# Patient Record
Sex: Female | Born: 1980 | Race: White | Hispanic: No | Marital: Single | Smoking: Never smoker
Health system: Southern US, Community
[De-identification: ages and names within clinical notes are randomized; demographics above are authoritative.]

## PROBLEM LIST (undated history)

## (undated) DIAGNOSIS — F419 Anxiety disorder, unspecified: Secondary | ICD-10-CM

## (undated) DIAGNOSIS — I10 Essential (primary) hypertension: Secondary | ICD-10-CM

## (undated) DIAGNOSIS — F410 Panic disorder [episodic paroxysmal anxiety] without agoraphobia: Secondary | ICD-10-CM

## (undated) DIAGNOSIS — E119 Type 2 diabetes mellitus without complications: Secondary | ICD-10-CM

## (undated) DIAGNOSIS — E7800 Pure hypercholesterolemia, unspecified: Secondary | ICD-10-CM

## (undated) HISTORY — PX: CYSTECTOMY: SHX5119

## (undated) HISTORY — PX: APPENDECTOMY: SHX54

## (undated) HISTORY — PX: ABDOMINAL HYSTERECTOMY: SHX81

## (undated) HISTORY — PX: LITHOTRIPSY: SUR834

---

## 2005-01-03 ENCOUNTER — Ambulatory Visit: Payer: Self-pay | Admitting: Nurse Practitioner

## 2005-01-07 ENCOUNTER — Ambulatory Visit: Payer: Self-pay | Admitting: *Deleted

## 2005-01-07 ENCOUNTER — Ambulatory Visit: Payer: Self-pay | Admitting: Nurse Practitioner

## 2006-09-03 ENCOUNTER — Emergency Department (HOSPITAL_COMMUNITY): Admission: EM | Admit: 2006-09-03 | Discharge: 2006-09-04 | Payer: Self-pay | Admitting: Emergency Medicine

## 2007-08-24 ENCOUNTER — Other Ambulatory Visit: Admission: RE | Admit: 2007-08-24 | Discharge: 2007-08-24 | Payer: Self-pay | Admitting: Obstetrics and Gynecology

## 2008-06-15 ENCOUNTER — Ambulatory Visit (HOSPITAL_COMMUNITY): Admission: RE | Admit: 2008-06-15 | Discharge: 2008-06-15 | Payer: Self-pay | Admitting: Neurology

## 2008-10-13 ENCOUNTER — Other Ambulatory Visit: Admission: RE | Admit: 2008-10-13 | Discharge: 2008-10-13 | Payer: Self-pay | Admitting: Obstetrics and Gynecology

## 2008-10-31 ENCOUNTER — Encounter: Admission: RE | Admit: 2008-10-31 | Discharge: 2008-10-31 | Payer: Self-pay | Admitting: Family Medicine

## 2009-12-15 ENCOUNTER — Other Ambulatory Visit: Admission: RE | Admit: 2009-12-15 | Discharge: 2009-12-15 | Payer: Self-pay | Admitting: Obstetrics and Gynecology

## 2011-01-04 NOTE — Op Note (Signed)
Crystal Ray, Crystal Ray               ACCOUNT NO.:  [id number]   MEDICAL RECORD NO.:  [id number]          PATIENT TYPE:  EMS   LOCATION:  ED                           FACILITY:  [facility]   PHYSICIAN:  Lucky Cowboy, MD         DATE OF BIRTH:  12-20-1980   DATE OF PROCEDURE:  09/04/2006  DATE OF DISCHARGE:                               OPERATIVE REPORT   PREOPERATIVE DIAGNOSES:  1. Complex right lateral lower lip laceration.  2. Simple left cheek lacerations.  3. Left nasal fracture.  4. Chin laceration.   POSTOPERATIVE DIAGNOSIS:  1. Complex right lateral lower lip laceration.  2. Simple left cheek lacerations.  3. Left nasal fracture.  4. Chin laceration.   PROCEDURE:  1. Closure of complex right lower lip and chin.  2. Closure simple left cheek lacerations.   SURGEON:  Lucky Cowboy, MD   ANESTHESIA:  Local.   ESTIMATED BLOOD LOSS:  Less than 5 mL.   SPECIMENS:  None.   COMPLICATIONS:  None.   INDICATIONS:  The patient is a 30 year old female who fell down some  hardwood stairs earlier this evening.  A CT scan performed indicated a  left nasal fracture, but no other bony abnormalities.  Findings on  physical examination were all normal with the exception of a complex  through-and-through left lateral lower lip and chin laceration that was  5 cm in length.  This extended into the mucosal surface.  Other small  lacerations were noted in the left cheek.   PROCEDURE:  The patient remained on the emergency room gurney.  She was  given morphine and Versed for sedation.  Betadine was used to prep all  wounds; and the procedure was carried out sterilely after sterile  draping.  Irrigation was performed to all wounds.  The lower lip and  chin was reapproximated in a simple interrupted buried fashion using 4-0  Vicryl.  The outer skin was closed in a simple interrupted fashion,  using 5-0 nylon.  The mucosal surface was closed in a simple interrupted  stitch using 4-0 Vicryl.  The  wound was cleansed and bacitracin ointment  was applied.  The skin suture was applied to the left cheek lacerations  which numbered 2; and more proximally 1 cm each.  They were  reapproximated in a simple interrupted fashion using 5-0 Prolene.   It was noted that the patient did have some bloody crusting in the left  nasal cavity, but there was no laceration noted; and no septal hematoma  on either side.  The patient also was noted to have inability to depress  the right lower lip for obvious laceration location.  It did appear that  sensation was intact to gross detection as she had already been given  morphine prior to my arrival.   DISCHARGE INSTRUCTIONS INCLUDE:  To keep the wounds dry and clean with  saline applied and apply Bacitracin ointment.   MEDICATIONS FOR DISCHARGE:  1. Augmentin 875 mg one p.o. b.i.d. for 10 days.  2. Peridex oral  rinse 480 mL, 10 mL, swish and  spit out tid for 7      days.  3. Tylenol No. 3 #30 with no refills and was 1-2 p.o. q. 4-6 h. p.r.n.      pain.   She returned to the office in 5-6 days for suture removal.      Lucky Cowboy, MD  Electronically Signed     SJ/MEDQ  D:  09/04/2006  T:  09/04/2006  Job:  [id number]

## 2017-07-11 ENCOUNTER — Encounter: Payer: Self-pay | Admitting: *Deleted

## 2017-07-11 NOTE — Congregational Nurse Program (Signed)
Congregational Nurse Program Note  Date of Encounter: 07/11/2017  Past Medical History: No past medical history on file.  Encounter Details: CNP Questionnaire - 07/11/17 1056      Questionnaire   Patient Status  Not Applicable    Race  White or Caucasian    Location Patient Served At  Not Applicable    Insurance  Medicaid    Food  No food insecurities    Housing/Utilities  No permanent housing    Transportation  No transportation needs    Interpersonal Safety  Yes, feel physically and emotionally safe where you currently live    Medication  No medication insecurities    Medical Provider  Yes    Referrals  Medicaid    ED Visit Averted  Not Applicable    Life-Saving Intervention Made  Not Applicable

## 2017-07-11 NOTE — Progress Notes (Signed)
Patient ID: Crystal PerfectShannan O'Neal, female   DOB: 09/09/1980, 36 y.o.   MRN: [id number] Patient wanting information on medicaid usage and changing of md within the system.  Encouraged to call the local ss department for further instructions.

## 2017-07-11 NOTE — Patient Instructions (Addendum)
Go to the local social [place] office or call on Monday [phone] for instructions on how to change providers. Health Maintenance, Female Adopting a healthy lifestyle and getting preventive care can go a long way to promote health and wellness. Talk with your health care provider about what schedule of regular examinations is right for you. This is a good chance for you to check in with your provider about disease prevention and staying healthy. In between checkups, there are plenty of things you can do on your own. Experts have done a lot of research about which lifestyle changes and preventive measures are most likely to keep you healthy. Ask your health care provider for more information. Weight and diet Eat a healthy diet  Be sure to include plenty of vegetables, fruits, low-fat dairy products, and lean protein.  Do not eat a lot of foods high in solid fats, added sugars, or salt.  Get regular exercise. This is one of the most important things you can do for your health. ? Most adults should exercise for at least 150 minutes each week. The exercise should increase your heart rate and make you sweat (moderate-intensity exercise). ? Most adults should also do strengthening exercises at least twice a week. This is in addition to the moderate-intensity exercise.  Maintain a healthy weight  Body mass index (BMI) is a measurement that can be used to identify possible weight problems. It estimates body fat based on height and weight. Your health care provider can help determine your BMI and help you achieve or maintain a healthy weight.  For females 31 years of age and older: ? A BMI below 18.5 is considered underweight. ? A BMI of 18.5 to 24.9 is normal. ? A BMI of 25 to 29.9 is considered overweight. ? A BMI of 30 and above is considered obese.  Watch levels of cholesterol and blood lipids  You should start having your blood tested for lipids and cholesterol at 38 years of age, then have  this test every 5 years.  You may need to have your cholesterol levels checked more often if: ? Your lipid or cholesterol levels are high. ? You are older than 38 years of age. ? You are at high risk for heart disease.  Cancer screening Lung Cancer  Lung cancer screening is recommended for adults 44-32 years old who are at high risk for lung cancer because of a history of smoking.  A yearly low-dose CT scan of the lungs is recommended for people who: ? Currently smoke. ? Have quit within the past 15 years. ? Have at least a 30-pack-year history of smoking. A pack year is smoking an average of one pack of cigarettes a day for 1 year.  Yearly screening should continue until it has been 15 years since you quit.  Yearly screening should stop if you develop a health problem that would prevent you from having lung cancer treatment.  Breast Cancer  Practice breast self-awareness. This means understanding how your breasts normally appear and feel.  It also means doing regular breast self-exams. Let your health care provider know about any changes, no matter how small.  If you are in your 20s or 30s, you should have a clinical breast exam (CBE) by a health care provider every 1-3 years as part of a regular health exam.  If you are 55 or older, have a CBE every year. Also consider having a breast X-ray (mammogram) every year.  If you have a family history  of breast cancer, talk to your health care provider about genetic screening.  If you are at high risk for breast cancer, talk to your health care provider about having an MRI and a mammogram every year.  Breast cancer gene (BRCA) assessment is recommended for women who have family members with BRCA-related cancers. BRCA-related cancers include: ? Breast. ? Ovarian. ? Tubal. ? Peritoneal cancers.  Results of the assessment will determine the need for genetic counseling and BRCA1 and BRCA2 testing.  Cervical Cancer Your health care  provider may recommend that you be screened regularly for cancer of the pelvic organs (ovaries, uterus, and vagina). This screening involves a pelvic examination, including checking for microscopic changes to the surface of your cervix (Pap test). You may be encouraged to have this screening done every 3 years, beginning at age 20.  For women ages 58-65, health care providers may recommend pelvic exams and Pap testing every 3 years, or they may recommend the Pap and pelvic exam, combined with testing for human papilloma virus (HPV), every 5 years. Some types of HPV increase your risk of cervical cancer. Testing for HPV may also be done on women of any age with unclear Pap test results.  Other health care providers may not recommend any screening for nonpregnant women who are considered low risk for pelvic cancer and who do not have symptoms. Ask your health care provider if a screening pelvic exam is right for you.  If you have had past treatment for cervical cancer or a condition that could lead to cancer, you need Pap tests and screening for cancer for at least 20 years after your treatment. If Pap tests have been discontinued, your risk factors (such as having a new sexual partner) need to be reassessed to determine if screening should resume. Some women have medical problems that increase the chance of getting cervical cancer. In these cases, your health care provider may recommend more frequent screening and Pap tests.  Colorectal Cancer  This type of cancer can be detected and often prevented.  Routine colorectal cancer screening usually begins at 38 years of age and continues through 38 years of age.  Your health care provider may recommend screening at an earlier age if you have risk factors for colon cancer.  Your health care provider may also recommend using home test kits to check for hidden blood in the stool.  A small camera at the end of a tube can be used to examine your colon  directly (sigmoidoscopy or colonoscopy). This is done to check for the earliest forms of colorectal cancer.  Routine screening usually begins at age 27.  Direct examination of the colon should be repeated every 5-10 years through 38 years of age. However, you may need to be screened more often if early forms of precancerous polyps or small growths are found.  Skin Cancer  Check your skin from head to toe regularly.  Tell your health care provider about any new moles or changes in moles, especially if there is a change in a mole's shape or color.  Also tell your health care provider if you have a mole that is larger than the size of a pencil eraser.  Always use sunscreen. Apply sunscreen liberally and repeatedly throughout the day.  Protect yourself by wearing long sleeves, pants, a wide-brimmed hat, and sunglasses whenever you are outside.  Heart disease, diabetes, and high blood pressure  High blood pressure causes heart disease and increases the risk of stroke. High  blood pressure is more likely to develop in: ? People who have blood pressure in the high end of the normal range (130-139/85-89 mm Hg). ? People who are overweight or obese. ? People who are African American.  If you are 90-2 years of age, have your blood pressure checked every 3-5 years. If you are 53 years of age or older, have your blood pressure checked every year. You should have your blood pressure measured twice-once when you are at a hospital or clinic, and once when you are not at a hospital or clinic. Record the average of the two measurements. To check your blood pressure when you are not at a hospital or clinic, you can use: ? An automated blood pressure machine at a pharmacy. ? A home blood pressure monitor.  If you are between 37 years and 1 years old, ask your health care provider if you should take aspirin to prevent strokes.  Have regular diabetes screenings. This involves taking a blood sample to  check your fasting blood sugar level. ? If you are at a normal weight and have a low risk for diabetes, have this test once every three years after 38 years of age. ? If you are overweight and have a high risk for diabetes, consider being tested at a younger age or more often. Preventing infection Hepatitis B  If you have a higher risk for hepatitis B, you should be screened for this virus. You are considered at high risk for hepatitis B if: ? You were born in a country where hepatitis B is common. Ask your health care provider which countries are considered high risk. ? Your parents were born in a high-risk country, and you have not been immunized against hepatitis B (hepatitis B vaccine). ? You have HIV or AIDS. ? You use needles to inject street drugs. ? You live with someone who has hepatitis B. ? You have had sex with someone who has hepatitis B. ? You get hemodialysis treatment. ? You take certain medicines for conditions, including cancer, organ transplantation, and autoimmune conditions.  Hepatitis C  Blood testing is recommended for: ? Everyone born from [birth year] through 1967. ? Anyone with known risk factors for hepatitis C.  Sexually transmitted infections (STIs)  You should be screened for sexually transmitted infections (STIs) including gonorrhea and chlamydia if: ? You are sexually active and are younger than 38 years of age. ? You are older than 38 years of age and your health care provider tells you that you are at risk for this type of infection. ? Your sexual activity has changed since you were last screened and you are at an increased risk for chlamydia or gonorrhea. Ask your health care provider if you are at risk.  If you do not have HIV, but are at risk, it may be recommended that you take a prescription medicine daily to prevent HIV infection. This is called pre-exposure prophylaxis (PrEP). You are considered at risk if: ? You are sexually active and do not regularly  use condoms or know the HIV status of your partner(s). ? You take drugs by injection. ? You are sexually active with a partner who has HIV.  Talk with your health care provider about whether you are at high risk of being infected with HIV. If you choose to begin PrEP, you should first be tested for HIV. You should then be tested every 3 months for as long as you are taking PrEP. Pregnancy  If you are  premenopausal and you may become pregnant, ask your health care provider about preconception counseling.  If you may become pregnant, take 400 to 800 micrograms (mcg) of folic acid every day.  If you want to prevent pregnancy, talk to your health care provider about birth control (contraception). Osteoporosis and menopause  Osteoporosis is a disease in which the bones lose minerals and strength with aging. This can result in serious bone fractures. Your risk for osteoporosis can be identified using a bone density scan.  If you are 23 years of age or older, or if you are at risk for osteoporosis and fractures, ask your health care provider if you should be screened.  Ask your health care provider whether you should take a calcium or vitamin D supplement to lower your risk for osteoporosis.  Menopause may have certain physical symptoms and risks.  Hormone replacement therapy may reduce some of these symptoms and risks. Talk to your health care provider about whether hormone replacement therapy is right for you. Follow these instructions at home:  Schedule regular health, dental, and eye exams.  Stay current with your immunizations.  Do not use any tobacco products including cigarettes, chewing tobacco, or electronic cigarettes.  If you are pregnant, do not drink alcohol.  If you are breastfeeding, limit how much and how often you drink alcohol.  Limit alcohol intake to no more than 1 drink per day for nonpregnant women. One drink equals 12 ounces of beer, 5 ounces of wine, or 1 ounces  of hard liquor.  Do not use street drugs.  Do not share needles.  Ask your health care provider for help if you need support or information about quitting drugs.  Tell your health care provider if you often feel depressed.  Tell your health care provider if you have ever been abused or do not feel safe at home. This information is not intended to replace advice given to you by your health care provider. Make sure you discuss any questions you have with your health care provider. Document Released: 02/18/2011 Document Revised: 01/11/2016 Document Reviewed: 05/09/2015 Elsevier Interactive Patient Education  [employer].

## 2017-12-07 ENCOUNTER — Emergency Department (HOSPITAL_COMMUNITY)
Admission: EM | Admit: 2017-12-07 | Discharge: 2017-12-07 | Disposition: A | Discharge status: Home / Self Care | Payer: Medicaid Other | Attending: Emergency Medicine | Admitting: Emergency Medicine

## 2017-12-07 ENCOUNTER — Encounter (HOSPITAL_COMMUNITY): Payer: Self-pay

## 2017-12-07 ENCOUNTER — Emergency Department (HOSPITAL_COMMUNITY): Payer: Medicaid Other

## 2017-12-07 DIAGNOSIS — S52602A Unspecified fracture of lower end of left ulna, initial encounter for closed fracture: Secondary | ICD-10-CM | POA: Diagnosis not present

## 2017-12-07 DIAGNOSIS — E119 Type 2 diabetes mellitus without complications: Secondary | ICD-10-CM | POA: Diagnosis not present

## 2017-12-07 DIAGNOSIS — I10 Essential (primary) hypertension: Secondary | ICD-10-CM | POA: Diagnosis not present

## 2017-12-07 DIAGNOSIS — S52502A Unspecified fracture of the lower end of left radius, initial encounter for closed fracture: Secondary | ICD-10-CM | POA: Diagnosis not present

## 2017-12-07 DIAGNOSIS — W010XXA Fall on same level from slipping, tripping and stumbling without subsequent striking against object, initial encounter: Secondary | ICD-10-CM | POA: Diagnosis not present

## 2017-12-07 DIAGNOSIS — Y9389 Activity, other specified: Secondary | ICD-10-CM | POA: Insufficient documentation

## 2017-12-07 DIAGNOSIS — J069 Acute upper respiratory infection, unspecified: Secondary | ICD-10-CM | POA: Insufficient documentation

## 2017-12-07 DIAGNOSIS — Y999 Unspecified external cause status: Secondary | ICD-10-CM | POA: Insufficient documentation

## 2017-12-07 DIAGNOSIS — Z7984 Long term (current) use of oral hypoglycemic drugs: Secondary | ICD-10-CM | POA: Insufficient documentation

## 2017-12-07 DIAGNOSIS — Y9289 Other specified places as the place of occurrence of the external cause: Secondary | ICD-10-CM | POA: Insufficient documentation

## 2017-12-07 DIAGNOSIS — B9789 Other viral agents as the cause of diseases classified elsewhere: Secondary | ICD-10-CM | POA: Diagnosis not present

## 2017-12-07 DIAGNOSIS — S6992XA Unspecified injury of left wrist, hand and finger(s), initial encounter: Secondary | ICD-10-CM | POA: Diagnosis present

## 2017-12-07 HISTORY — DX: Pure hypercholesterolemia, unspecified: E78.00

## 2017-12-07 HISTORY — DX: Anxiety disorder, unspecified: F41.9

## 2017-12-07 HISTORY — DX: Panic disorder (episodic paroxysmal anxiety): F41.0

## 2017-12-07 HISTORY — DX: Essential (primary) hypertension: I10

## 2017-12-07 HISTORY — DX: Type 2 diabetes mellitus without complications: E11.9

## 2017-12-07 LAB — CBG MONITORING, ED: Glucose-Capillary: 284 mg/dL — ABNORMAL HIGH (ref 65–99)

## 2017-12-07 MED ORDER — BENZONATATE 100 MG PO CAPS: 100.0000 mg | ORAL_CAPSULE | Freq: Three times a day (TID) | ORAL | 0 refills | Status: AC

## 2017-12-07 MED ORDER — HYDROCODONE-ACETAMINOPHEN 5-325 MG PO TABS: 1.0000 | ORAL_TABLET | Freq: Four times a day (QID) | ORAL | 0 refills | Status: AC | PRN

## 2017-12-07 MED ORDER — CETIRIZINE HCL 10 MG PO TABS: 10.0000 mg | ORAL_TABLET | Freq: Every day | ORAL | 0 refills | Status: AC

## 2017-12-07 MED ORDER — HYDROCODONE-ACETAMINOPHEN 5-325 MG PO TABS
2.0000 | ORAL_TABLET | Freq: Once | ORAL | Status: AC
  Administered 2017-12-07: 2 via ORAL
  Filled 2017-12-07: qty 2

## 2017-12-07 MED ORDER — IBUPROFEN 600 MG PO TABS: 600.0000 mg | ORAL_TABLET | Freq: Four times a day (QID) | ORAL | 0 refills | Status: AC | PRN

## 2017-12-07 NOTE — ED Notes (Signed)
Patient transported to X-ray 

## 2017-12-07 NOTE — ED Triage Notes (Signed)
[place] was in [place] on vacation, fell getting out of a hottub this afternoon and broke her L wrist, and told to follow ortho. Pt left hospital to catch flight home and states they need referral to ortho here and pain mediation that they did not receive.

## 2017-12-07 NOTE — Discharge Instructions (Addendum)
Medications: ibuprofen, Norco, Tessalon, Zyrtec  Treatment: Take ibuprofen every 6 hours as needed for your pain. Take 1-2 Norco every 6 hours as needed for severe pain. Take Tessalon every 8 hours for cough. Take Zyrtec 1-2 daily for nasal congestion. You can also use Flonase as prescribed over-the-counter.  Do not drink alcohol, drive, operate machinery or participate in any other potentially dangerous activities while taking opiate pain medication as it may make you sleepy. Do not take this medication with any other sedating medications, either prescription or over-the-counter. If you were prescribed Percocet or Vicodin, do not take these with acetaminophen (Tylenol) as it is already contained within these medications and overdose of Tylenol is dangerous.   This medication is an opiate (or narcotic) pain medication and can be habit forming.  Use it as little as possible to achieve adequate pain control.  Do not use or use it with extreme caution if you have a history of opiate abuse or dependence. This medication is intended for your use only - do not give any to anyone else and keep it in a secure place where nobody else, especially children, have access to it. It will also cause or worsen constipation, so you may want to consider taking an over-the-counter stool softener while you are taking this medication.  Follow-up: Please follow up with Dr. Eulah PontMurphy, hand doctor, for further evaluation and treatment of your wrist fracture. Please follow up with your doctor for further management of your sleep apnea and if your upper respiratory symptoms are not improving over the next 4-5 days. Please return to the emergency department if you develop any new or worsening symptoms including color change or numbness in your fingers, if you get your splint wet, severe shortness of breath, or any new or worsening symptoms.

## 2017-12-07 NOTE — ED Provider Notes (Addendum)
[facility] EMERGENCY DEPARTMENT Provider Note   CSN: [id number] Arrival date & time: 12/07/17  0206     History   Chief Complaint Chief Complaint  Patient presents with  . Medication Refill    HPI Crystal Ray is a 37 y.o. female with history of hypertension, hypercholesterolemia, diabetes who presents with left hand, arm, and elbow pain after falling yesterday.  Patient reports she was in [place] on vacation and slipped getting out of the hot tub.  She had x-rays and was splinted in [place], however she had to leave before she got her results or pain medication.  She was told to follow-up with an orthopedic doctor here.  She has also had a cough for 5 days.  It is dry.  She has had associated nasal congestion and bilateral ear pain and sore throat.  She denies any fever at home.  She has had mild shortness of breath.  She denies any chest pain.  She has not taken any medication prior to arrival except which she was given in the hospital in [place].  She denies any numbness or tingling to her hand, but has had pain in her fingers since the fall, in addition to her forearm and elbow.  HPI  Past Medical History:  Diagnosis Date  . Anxiety   . Diabetes mellitus without complication (HCC)   . Hypercholesterolemia   . Hypertension   . Panic disorder     There are no active problems to display for this patient.   Past Surgical History:  Procedure Laterality Date  . ABDOMINAL HYSTERECTOMY    . APPENDECTOMY    . CYSTECTOMY    . LITHOTRIPSY       OB History   None      Home Medications    Prior to Admission medications   Medication Sig Start Date End Date Taking? Authorizing Provider  atorvastatin (LIPITOR) 80 MG tablet Take 80 mg by mouth daily.   Yes [provider]  cycloSPORINE (RESTASIS) 0.05 % ophthalmic emulsion Place 1 drop into both eyes 2 (two) times daily.   Yes [provider]  insulin detemir (LEVEMIR) 100 UNIT/ML  injection Inject 20 Units into the skin 2 (two) times daily.   Yes [provider]  lisinopril (PRINIVIL,ZESTRIL) 40 MG tablet Take 40 mg by mouth daily.   Yes [provider]  metFORMIN (GLUCOPHAGE) 1000 MG tablet Take 1,000 mg by mouth 2 (two) times daily with a meal.   Yes [provider]  metoprolol succinate (TOPROL-XL) 100 MG 24 hr tablet Take 100 mg by mouth daily. Take with or immediately following a meal.   Yes [provider]  benzonatate (TESSALON) 100 MG capsule Take 1 capsule (100 mg total) by mouth every 8 (eight) hours. 12/07/17   Ellissa Ayo, Waylan Boga, PA-C  cetirizine (ZYRTEC ALLERGY) 10 MG tablet Take 1 tablet (10 mg total) by mouth daily. 12/07/17   Shamikia Linskey, Waylan Boga, PA-C  HYDROcodone-acetaminophen (NORCO/VICODIN) 5-325 MG tablet Take 1-2 tablets by mouth every 6 (six) hours as needed. 12/07/17   Jeryl Umholtz, Waylan Boga, PA-C  ibuprofen (ADVIL,MOTRIN) 600 MG tablet Take 1 tablet (600 mg total) by mouth every 6 (six) hours as needed. 12/07/17   Emi Holes, PA-C    Family History No family history on file.  Social History Social History   Tobacco Use  . Smoking status: Never Smoker  . Smokeless tobacco: Never Used  Substance Use Topics  . Alcohol use: Yes  .  Drug use: Never     Allergies   Dilantin [phenytoin sodium extended] and Sulfa antibiotics   Review of Systems Review of Systems  Constitutional: Negative for fever.  HENT: Positive for congestion, ear pain and sore throat.   Respiratory: Positive for cough and shortness of breath.   Cardiovascular: Negative for chest pain.  Musculoskeletal: Positive for arthralgias.  Neurological: Negative for numbness.     Physical Exam Updated Vital Signs BP 139/74 (BP Location: Right Arm)   Pulse (!) 117   Temp 97.9 F (36.6 C) (Oral)   Resp 18   SpO2 93%   Physical Exam  Constitutional: She appears well-developed and well-nourished. No distress.  HENT:  Head: Normocephalic and  atraumatic.  Right Ear: Tympanic membrane normal.  Left Ear: Tympanic membrane normal.  Mouth/Throat: Oropharynx is clear and moist. No oropharyngeal exudate, posterior oropharyngeal edema, posterior oropharyngeal erythema or tonsillar abscesses. Tonsils are 1+ on the right. Tonsils are 1+ on the left. No tonsillar exudate.  Eyes: Pupils are equal, round, and reactive to light. Conjunctivae are normal. Right eye exhibits no discharge. Left eye exhibits no discharge. No scleral icterus.  Neck: Normal range of motion. Neck supple. No thyromegaly present.  Cardiovascular: Regular rhythm, normal heart sounds and intact distal pulses. Exam reveals no gallop and no friction rub.  No murmur heard. Patient reports she has a high heart rate at baseline (115-120)  Pulmonary/Chest: Effort normal and breath sounds normal. No stridor. No respiratory distress. She has no wheezes. She has no rales.  Abdominal: Soft. Bowel sounds are normal. She exhibits no distension. There is no tenderness. There is no rebound and no guarding.  Musculoskeletal: She exhibits no edema.  Splint on left arm from elbow to MCPs, tenderness from the fingertips to elbow, good cap refill, sensation intact; sling in place  Lymphadenopathy:    She has no cervical adenopathy.  Neurological: She is alert. Coordination normal.  Skin: Skin is warm and dry. No rash noted. She is not diaphoretic. No pallor.  Psychiatric: She has a normal mood and affect.  Nursing note and vitals reviewed.    ED Treatments / Results  Labs (all labs ordered are listed, but only abnormal results are displayed) Labs Reviewed  CBG MONITORING, ED - Abnormal; Notable for the following components:      Result Value   Glucose-Capillary 284 (*)    All other components within normal limits    EKG None  Radiology Dg Chest 2 View  Result Date: 12/07/2017 CLINICAL DATA:  Dry cough EXAM: CHEST - 2 VIEW COMPARISON:  None. FINDINGS: Low lung volumes. No focal  consolidation or effusion. Normal heart size. No pneumothorax. IMPRESSION: No active cardiopulmonary disease.  Low lung volumes. Electronically Signed   By: Jasmine PangKim  Fujinaga M.D.   On: 12/07/2017 02:52   Dg Forearm Left  Result Date: 12/07/2017 CLINICAL DATA:  Larey SeatFell yesterday sustaining left wrist fracture. Placed in splint. EXAM: LEFT FOREARM - 2 VIEW COMPARISON:  None. FINDINGS: Overlying splint material limits assessment of osseous detail. There is a comminuted, impacted, intra-articular fracture of the distal radius demonstrating mild dorsal angulation. A mildly displaced ulnar styloid fracture is present. The more proximal radius and ulna appear intact. IMPRESSION: Distal radius and ulna fractures as above. Electronically Signed   By: Sebastian AcheAllen  Grady M.D.   On: 12/07/2017 08:07   Dg Wrist Complete Left  Result Date: 12/07/2017 CLINICAL DATA:  Larey SeatFell yesterday sustaining left wrist fracture. Placed in splint. EXAM: LEFT WRIST - COMPLETE  3+ VIEW COMPARISON:  None. FINDINGS: Overlying splint material limits assessment of osseous detail. There is a comminuted, impacted, intra-articular fracture of the distal radius demonstrating mild dorsal angulation. A mildly displaced ulnar styloid fracture is present. There is no dislocation. IMPRESSION: Distal radius and ulna fractures as above. Electronically Signed   By: Sebastian Ache M.D.   On: 12/07/2017 08:08    Procedures Procedures (including critical care time)  Medications Ordered in ED Medications  HYDROcodone-acetaminophen (NORCO/VICODIN) 5-325 MG per tablet 2 tablet (2 tablets Oral Given 12/07/17 9147)     Initial Impression / Assessment and Plan / ED Course  I have reviewed the triage vital signs and the nursing notes.  Pertinent labs & imaging results that were available during my care of the patient were reviewed by me and considered in my medical decision making (see chart for details).     Patient with distal radial and ulnar fractures of the  left wrist; comminuted, impacted, intra-articular fracture of the distal radius demonstrating mild dorsal angulation, mild displacement of this ulnar styloid; no dislocation.  Patient already splinted in [place].  Will leave in place.  Patient is neurovascularly intact.  Will refer to hand surgery for further evaluation and treatment.  Will discharge home with ibuprofen and short course of Norco for pain control.  I reviewed the [place] narcotic database and found no discrepancies.  Patient reports she had to leave before formal discharge did not get her results or pain medication before they had to catch their flight in [place].  Patient noted to be hypoxic while sleeping, has history of sleep apnea.  She reports she has a new sleep study scheduled for a CPAP machine.  Patient ambulated with pulse ox with oxygen saturations above 92% on room air.  Patient denies shortness of breath at this time.  We will also discharge home with supportive treatment for her URI including Tessalon and Zyrtec.  Chest x-ray is negative for pneumonia.  Follow-up to PCP for further management of this if symptoms are not improving.  Return precautions discussed.  Patient understands and agrees with plan.  Patient vitals stable and discharged in satisfactory condition.  Patient reports she has a baseline high heart rate (115-120).  Final Clinical Impressions(s) / ED Diagnoses   Final diagnoses:  Closed fracture of distal ends of left radius and ulna, initial encounter  Viral URI with cough    ED Discharge Orders        Ordered    HYDROcodone-acetaminophen (NORCO/VICODIN) 5-325 MG tablet  Every 6 hours PRN     12/07/17 0825    ibuprofen (ADVIL,MOTRIN) 600 MG tablet  Every 6 hours PRN     12/07/17 0825    benzonatate (TESSALON) 100 MG capsule  Every 8 hours     12/07/17 0825    cetirizine (ZYRTEC ALLERGY) 10 MG tablet  Daily     12/07/17 0825           Emi Holes, PA-C 12/07/17 0919    Derwood Kaplan,  MD 12/09/17 [phone]

## 2017-12-07 NOTE — ED Triage Notes (Signed)
Pt adds that she has a dry cough for the past three days and wants a chest x ray

## 2019-12-07 IMAGING — DX DG CHEST 2V
2 series · 2 of 2 positions shown · non-contrast
Comparison: None.

CLINICAL DATA: Dry cough

EXAM:
CHEST - 2 VIEW

[chest pa]
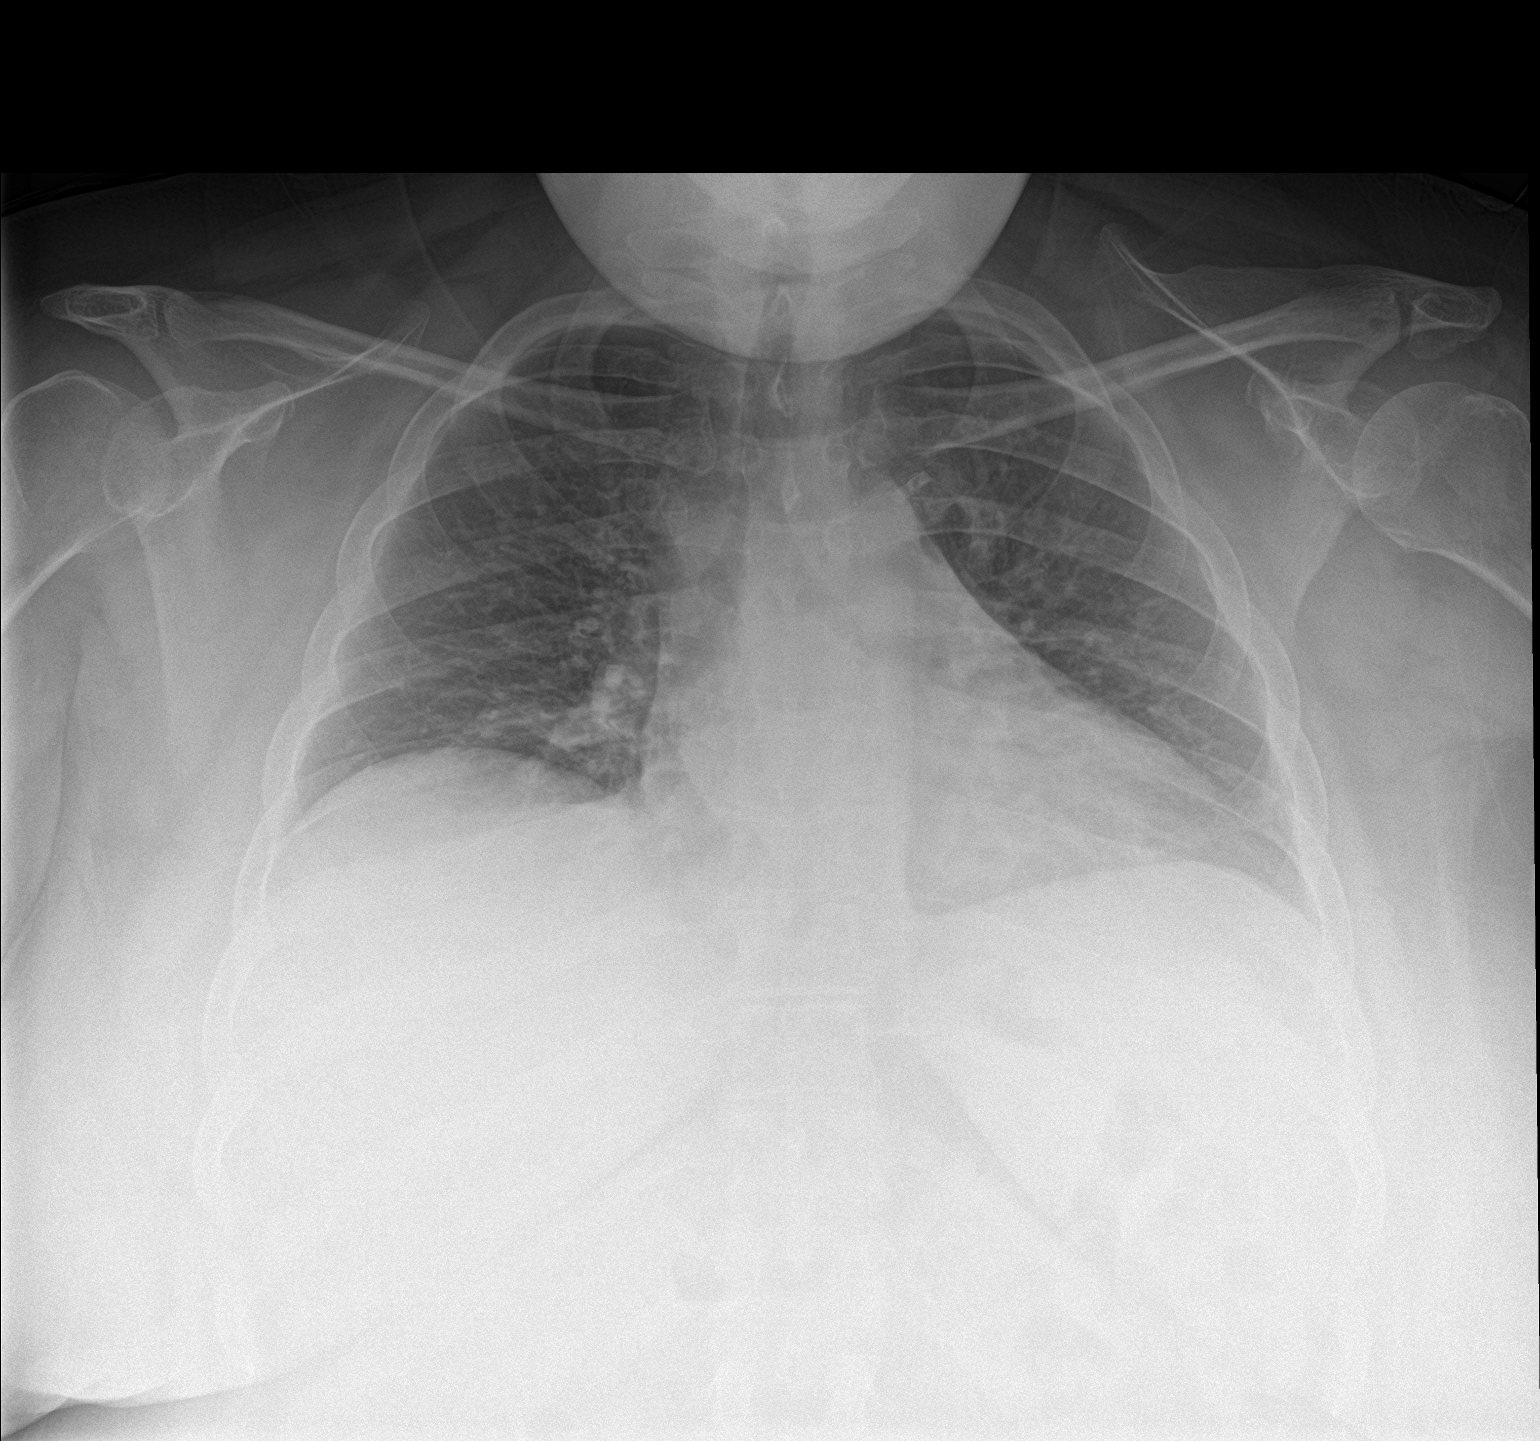

[chest lat]
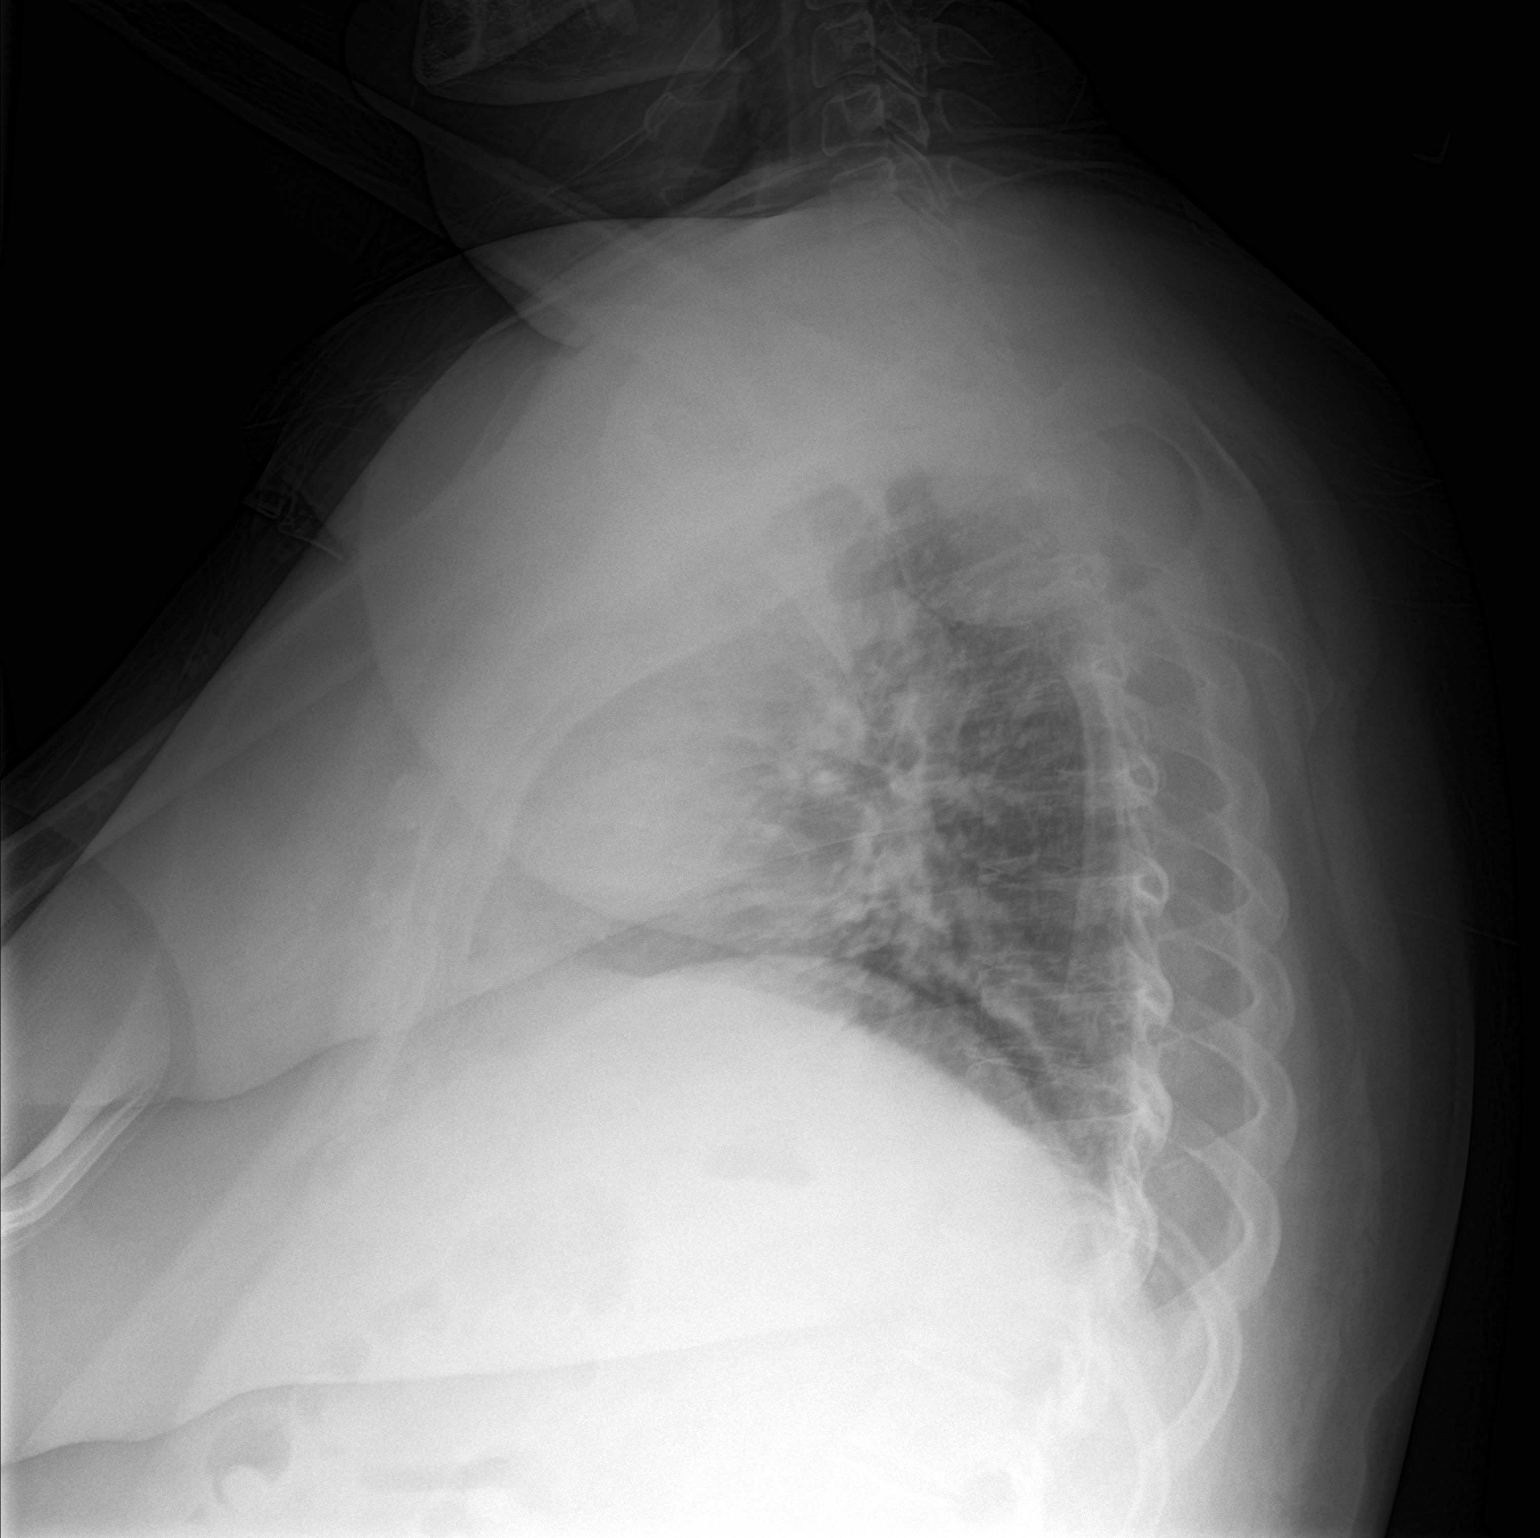

[2 of 2 positions shown; findings below may reference images not displayed]

FINDINGS: Low lung volumes. No focal consolidation or effusion. Normal heart
size. No pneumothorax.
IMPRESSION: No active cardiopulmonary disease.  Low lung volumes.
